# Patient Record
Sex: Female | Born: 1990 | Race: Black or African American | Hispanic: No | Marital: Single | Smoking: Never smoker
Health system: Southern US, Community
[De-identification: ages and names within clinical notes are randomized; demographics above are authoritative.]

---

## 2011-05-09 ENCOUNTER — Emergency Department: Payer: Self-pay | Admitting: Emergency Medicine

## 2012-04-18 IMAGING — CR DG CHEST 2V
1 series · 2 of 2 positions shown · non-contrast
Comparison: none

REASON FOR EXAM: cough, pain in upper back with breathing
COMMENTS:   LMP: Two weeks ago

PROCEDURE:     DXR - DXR CHEST PA (OR AP) AND LATERAL  - May 09, 2011  [DATE]
RESULT:     The lung fields are clear. The heart, mediastinal and osseous
structures show no significant abnormalities.

[Series 1: pa · 0.17mm/px · 2 of 2 slices shown]
[im 1/2]
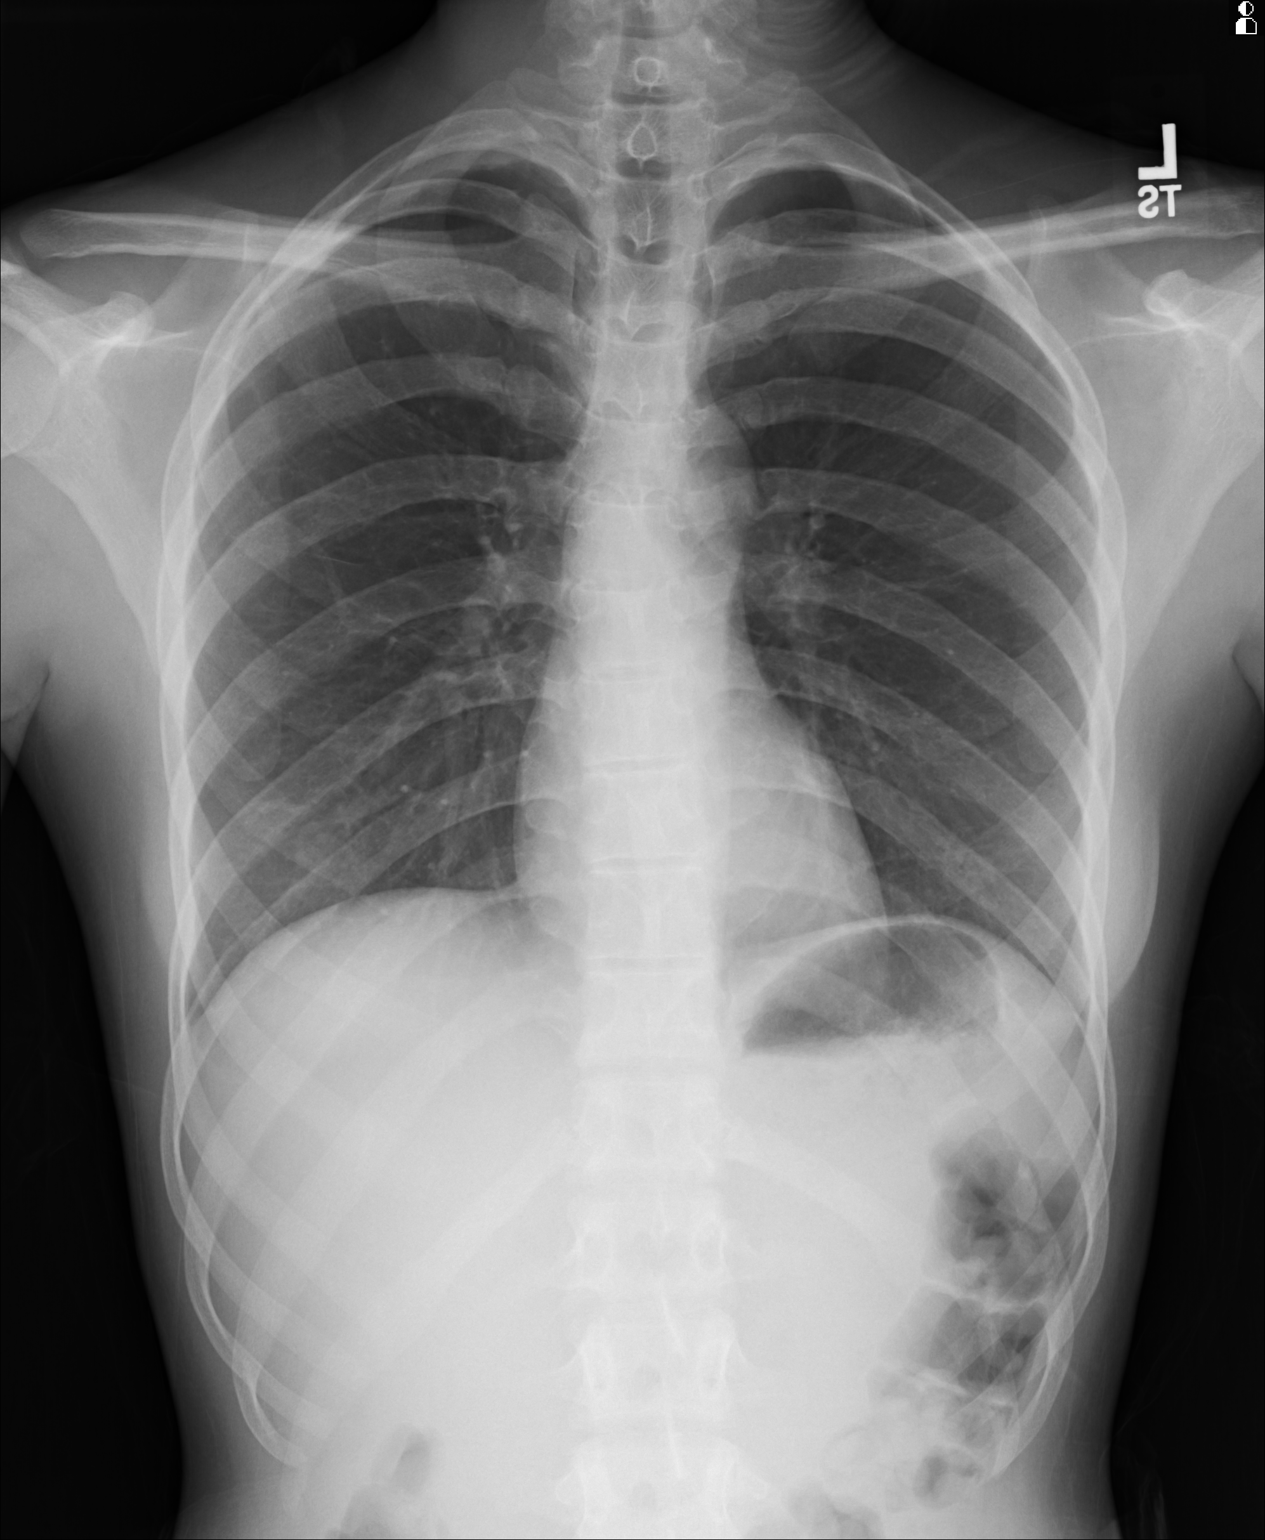
[im 2/2]
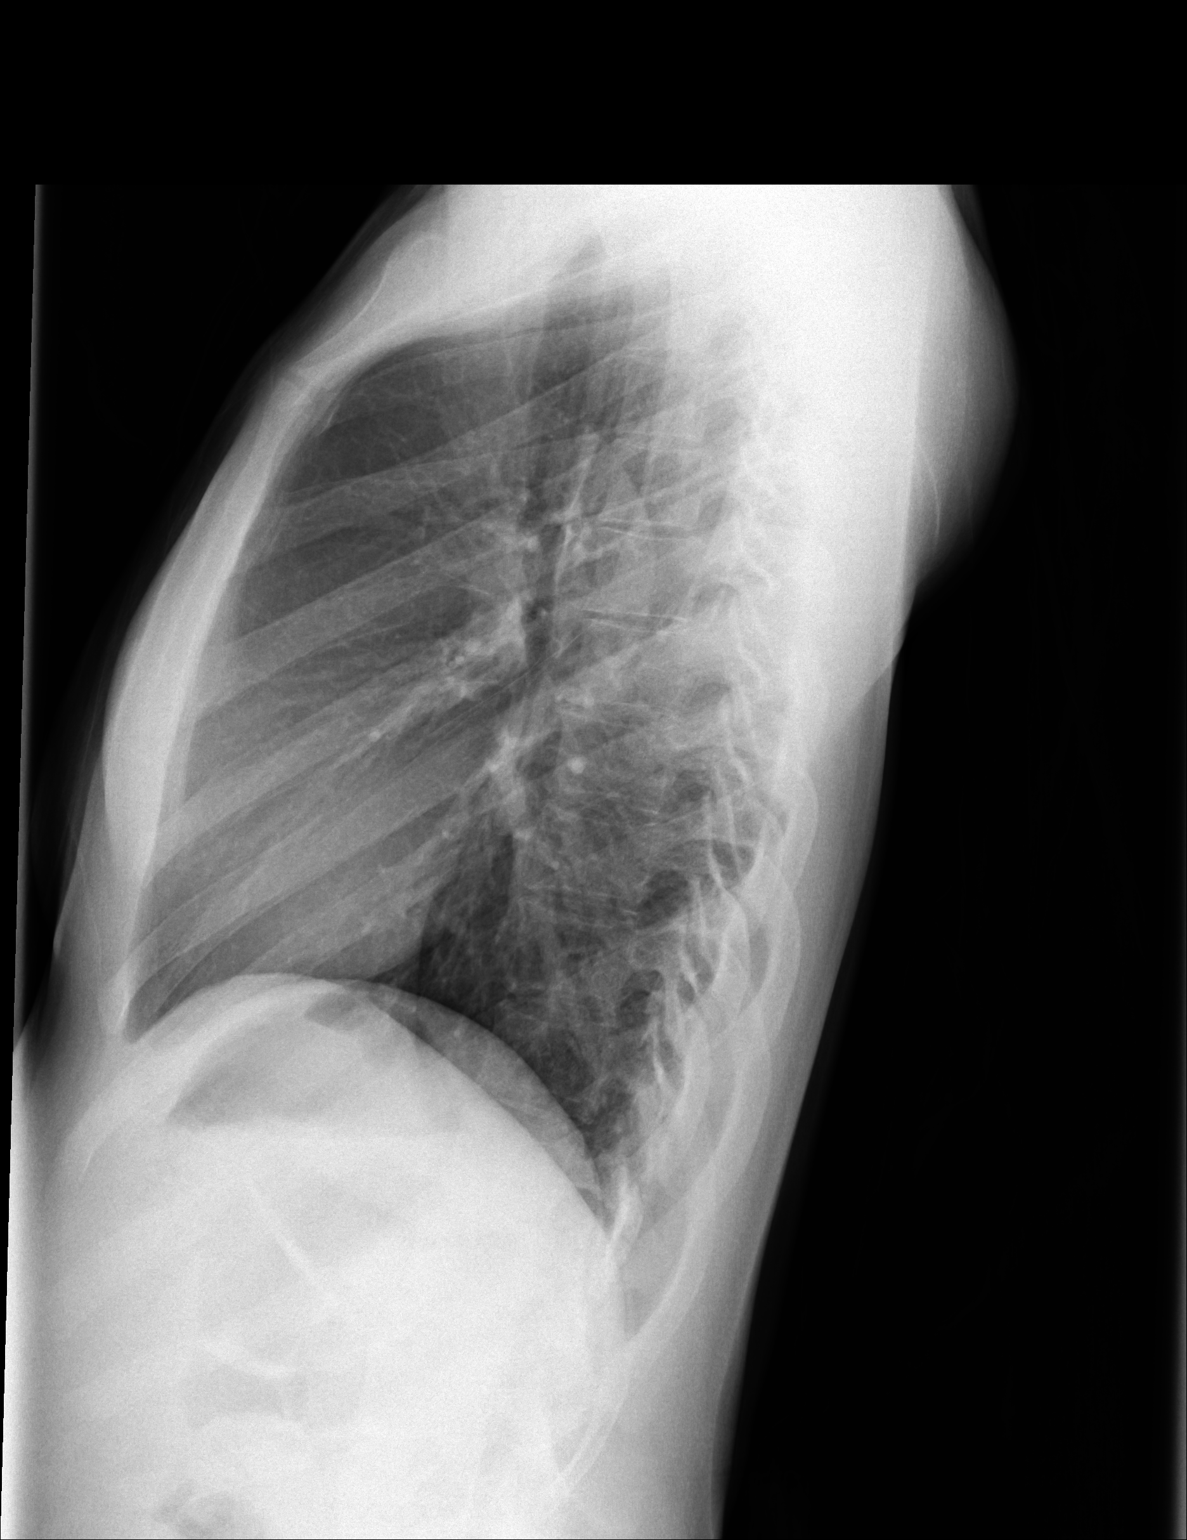

[2 of 2 positions shown; findings below may reference images not displayed]

IMPRESSION: 1.     No acute changes are identified.

## 2012-09-18 LAB — ACETAMINOPHEN LEVEL: Acetaminophen: <2 ug/mL

## 2012-09-18 LAB — TSH: Thyroid Stimulating Horm: 3.05 u[IU]/mL

## 2012-09-18 LAB — CBC
HGB: 13.8 g/dL (ref 12.0–16.0)
MCHC: 34.9 g/dL (ref 32.0–36.0)
Platelet: 293 10*3/uL (ref 150–440)
RBC: 4.16 10*6/uL (ref 3.80–5.20)
WBC: 5.6 10*3/uL (ref 3.6–11.0)

## 2012-09-18 LAB — URINALYSIS, COMPLETE
Bacteria: NONE SEEN
Ketone: NEGATIVE
Protein: NEGATIVE
Specific Gravity: 1.01 (ref 1.003–1.030)

## 2012-09-18 LAB — COMPREHENSIVE METABOLIC PANEL
Bilirubin,Total: 1.2 mg/dL — ABNORMAL HIGH (ref 0.2–1.0)
Co2: 27 mmol/L (ref 21–32)
Creatinine: 0.71 mg/dL (ref 0.60–1.30)
EGFR (Non-African Amer.): >60
Glucose: 70 mg/dL (ref 65–99)
Osmolality: 268 (ref 275–301)
SGPT (ALT): 18 U/L (ref 12–78)

## 2012-09-18 LAB — DRUG SCREEN, URINE
Amphetamines, Ur Screen: NEGATIVE (ref ?–1000)
Barbiturates, Ur Screen: NEGATIVE (ref ?–200)
Benzodiazepine, Ur Scrn: NEGATIVE (ref ?–200)
Cannabinoid 50 Ng, Ur ~~LOC~~: NEGATIVE (ref ?–50)
MDMA (Ecstasy)Ur Screen: NEGATIVE (ref ?–500)
Methadone, Ur Screen: NEGATIVE (ref ?–300)
Opiate, Ur Screen: NEGATIVE (ref ?–300)
Tricyclic, Ur Screen: NEGATIVE (ref ?–1000)

## 2012-09-18 LAB — ETHANOL: Ethanol: <3 mg/dL

## 2012-09-18 LAB — SALICYLATE LEVEL: Salicylates, Serum: <1.7 mg/dL

## 2012-09-19 ENCOUNTER — Inpatient Hospital Stay: Payer: Self-pay | Admitting: Psychiatry

## 2012-09-21 LAB — HCG, QUANTITATIVE, PREGNANCY: Beta Hcg, Quant.: 364 m[IU]/mL — ABNORMAL HIGH

## 2013-07-16 ENCOUNTER — Ambulatory Visit: Payer: Self-pay | Admitting: Family Medicine

## 2013-08-30 IMAGING — US US OB < 14 WEEKS - US OB TV
1 series · 14 of 28 positions shown · non-contrast
Comparison: none

REASON FOR EXAM: positive pregnancy test, recent D+C
COMMENTS:

PROCEDURE:     US  - US OB LESS THAN 14 WEEKS/W TRANS  - September 19, 2012  [DATE]
RESULT:     Comparison: None
TECHNIQUE: Multiple transabdominal gray-scale images and endovaginal
gray-scale images with doppler images of the pelvis performed.

[Series 1: us ob < 14 weeks - us ob tv · 0.25mm/px · 14 of 97 slices shown]
[im 4/97]
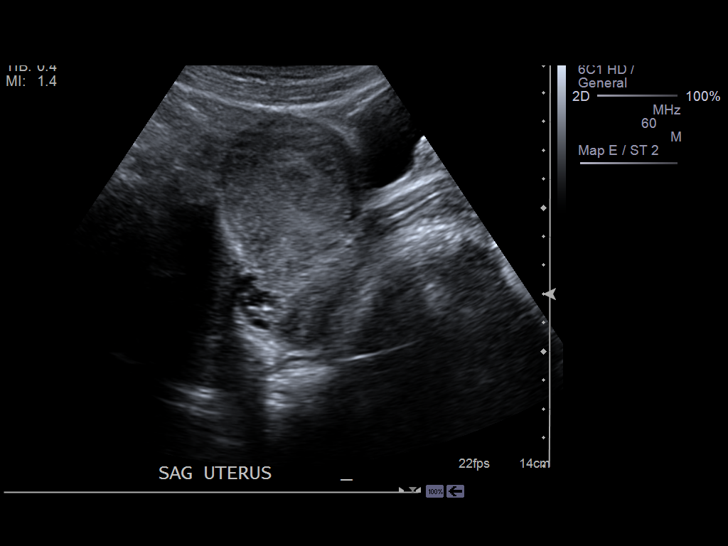
[im 11/97]
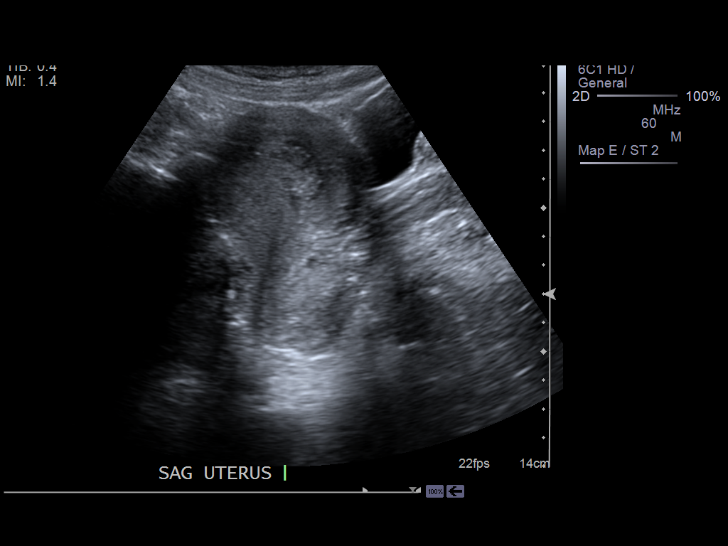
[im 18/97]
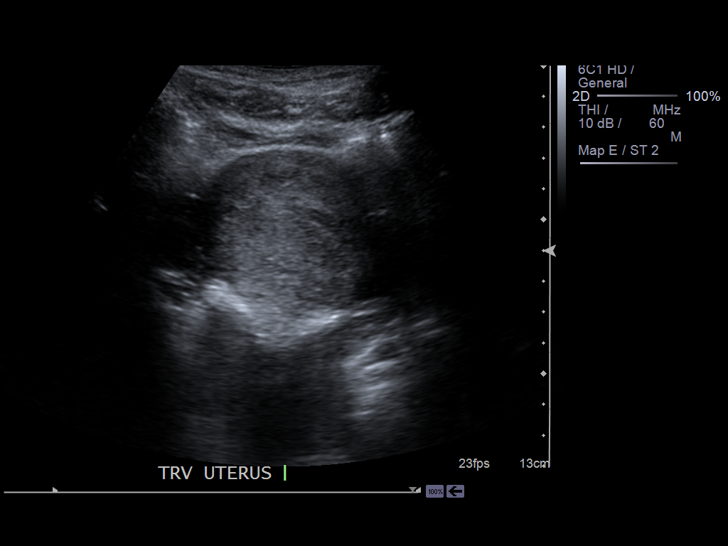
[im 25/97]
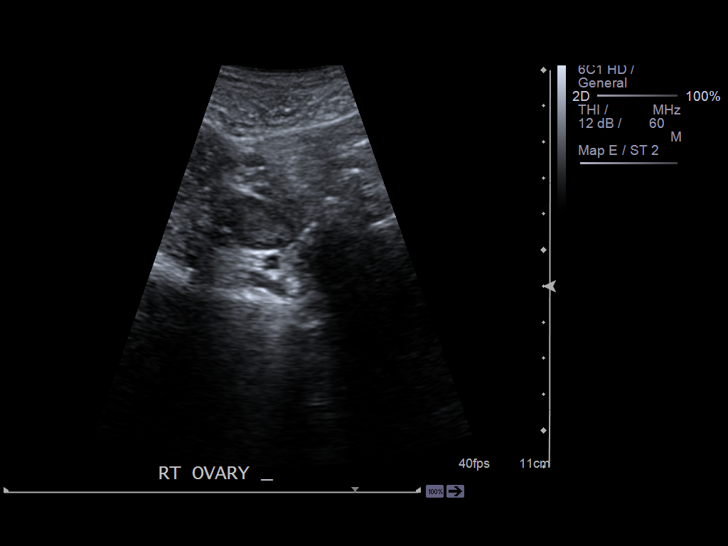
[im 33/97]
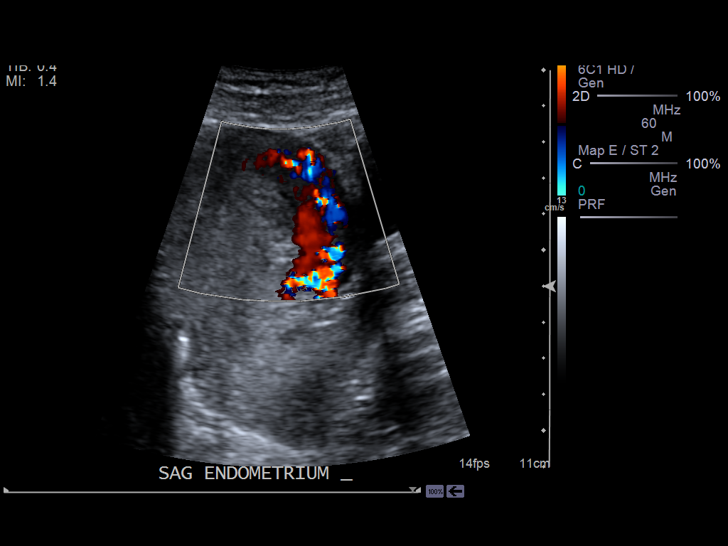
[im 40/97]
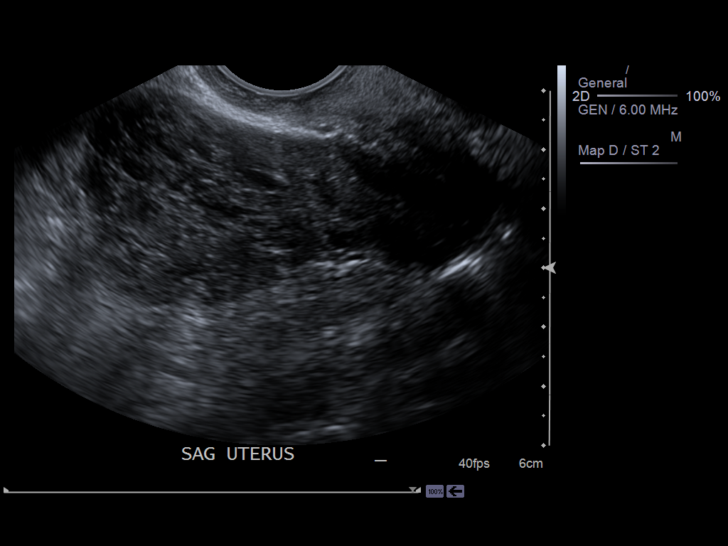
[im 47/97]
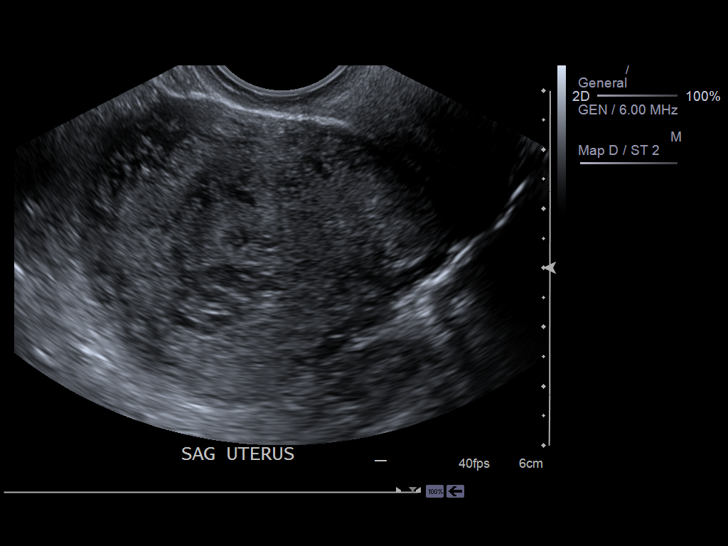
[im 54/97]
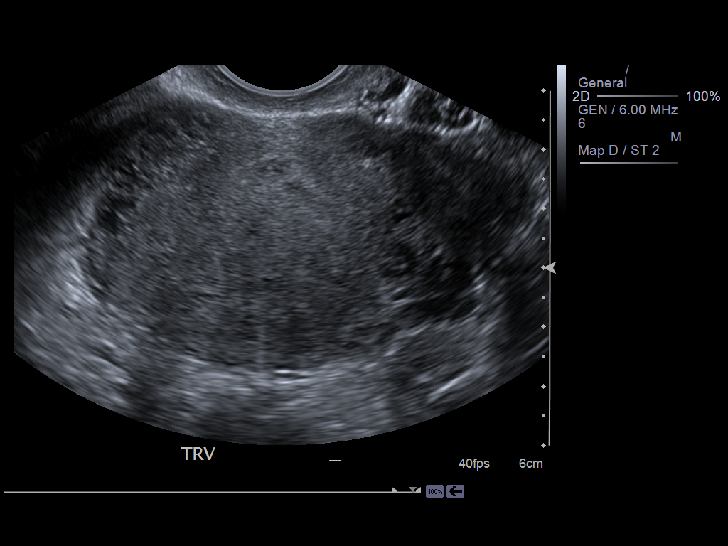
[im 61/97]
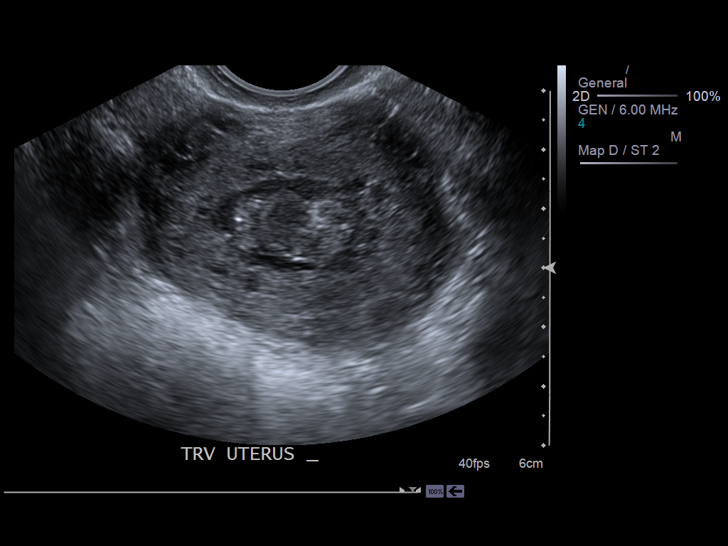
[im 68/97]
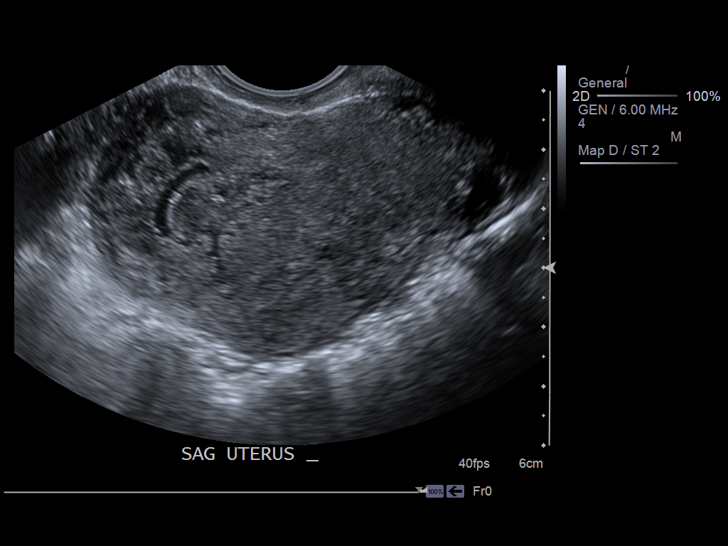
[im 75/97]
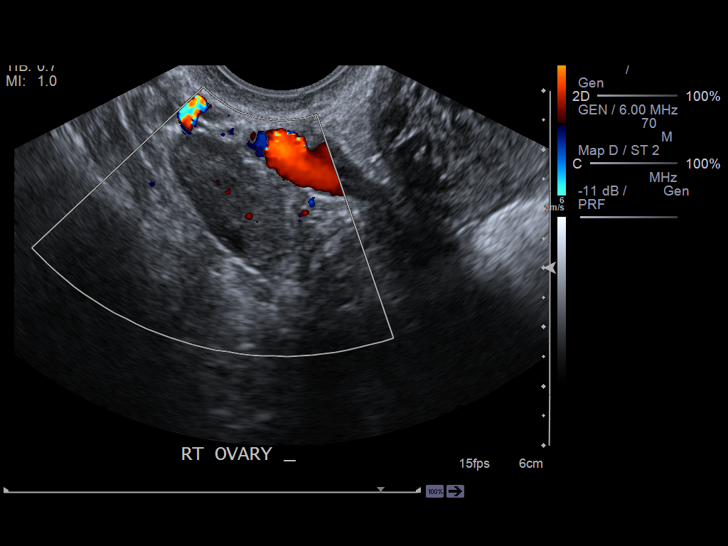
[im 82/97]
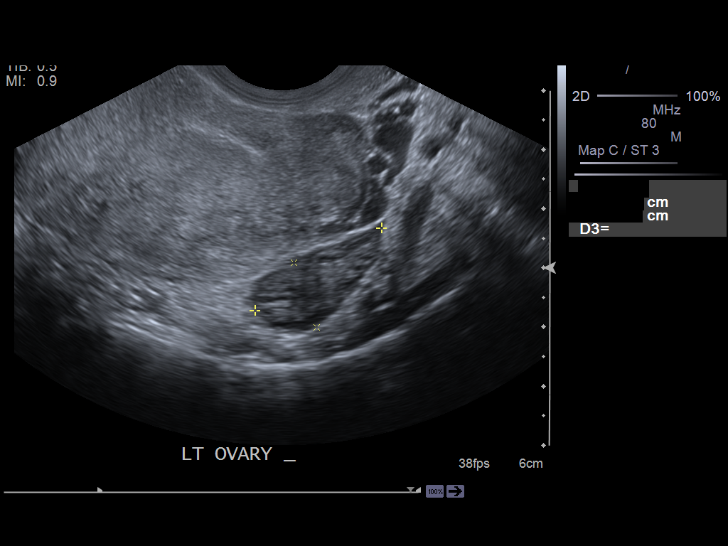
[im 89/97]
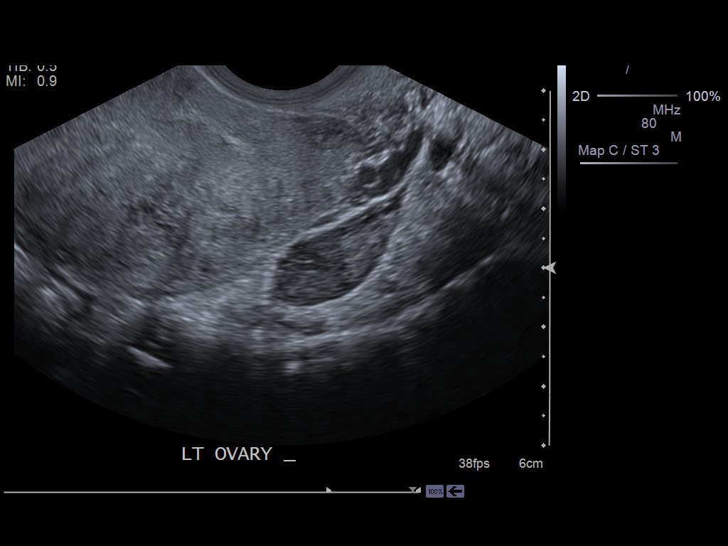
[im 97/97]
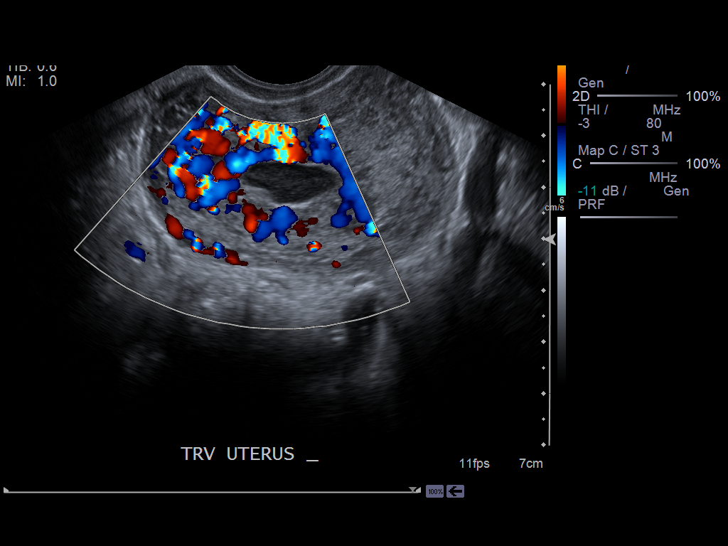

[14 of 28 positions shown; findings below may reference images not displayed]

FINDINGS: The uterus is normal in echotexture measuring 9 x 5.6 x 5.5 cm , with
transabdominal ultrasound. The endometrium is heterogeneous in echotexture
measuring 16.4 mm without internal Doppler flow..  There are no abnormal
solid or cystic myometrial mass lesions noted.

The right ovary measures 2.8 x 1.8 x 1.2 cm.  The left ovary measures 2.6 x
1.4 x 1.2 cm.  There is no adnexal mass.

There is no pelvic free fluid.
IMPRESSION: Heterogeneously mildly thickened endometrium likely reflecting blood
products. There is no significant internal Doppler flow. No definite
products of conception are identified, , but cannot be excluded given the
elevated beta-hCG. Recommend clinical correlation, serial quantitative beta
HCGs and followup ultrasound..

[REDACTED]

## 2014-03-06 ENCOUNTER — Ambulatory Visit: Payer: Self-pay | Admitting: Internal Medicine

## 2014-03-06 LAB — COMPREHENSIVE METABOLIC PANEL
AST: 17 U/L (ref 15–37)
Albumin: 3.9 g/dL (ref 3.4–5.0)
Alkaline Phosphatase: 70 U/L
Anion Gap: 14 (ref 7–16)
BUN: 11 mg/dL (ref 7–18)
Bilirubin,Total: 0.7 mg/dL (ref 0.2–1.0)
CHLORIDE: 103 mmol/L (ref 98–107)
CO2: 23 mmol/L (ref 21–32)
Calcium, Total: 9 mg/dL (ref 8.5–10.1)
Creatinine: 0.76 mg/dL (ref 0.60–1.30)
Glucose: 97 mg/dL (ref 65–99)
Osmolality: 279 (ref 275–301)
Potassium: 3.4 mmol/L — ABNORMAL LOW (ref 3.5–5.1)
SGPT (ALT): 13 U/L — ABNORMAL LOW
Sodium: 140 mmol/L (ref 136–145)
Total Protein: 8.1 g/dL (ref 6.4–8.2)

## 2014-03-06 LAB — CBC WITH DIFFERENTIAL/PLATELET
BASOS PCT: 0.7 %
Basophil #: 0.1 10*3/uL (ref 0.0–0.1)
EOS PCT: 0.5 %
Eosinophil #: 0 10*3/uL (ref 0.0–0.7)
HCT: 35.4 % (ref 35.0–47.0)
HGB: 12.1 g/dL (ref 12.0–16.0)
Lymphocyte #: 2.8 10*3/uL (ref 1.0–3.6)
Lymphocyte %: 29.2 %
MCH: 31.9 pg (ref 26.0–34.0)
MCHC: 34 g/dL (ref 32.0–36.0)
MCV: 94 fL (ref 80–100)
MONO ABS: 0.4 x10 3/mm (ref 0.2–0.9)
MONOS PCT: 4.4 %
NEUTROS ABS: 6.3 10*3/uL (ref 1.4–6.5)
Neutrophil %: 65.2 %
Platelet: 277 10*3/uL (ref 150–440)
RBC: 3.78 10*6/uL — AB (ref 3.80–5.20)
RDW: 13.4 % (ref 11.5–14.5)
WBC: 9.6 10*3/uL (ref 3.6–11.0)

## 2014-03-06 LAB — PROTIME-INR
INR: 1
PROTHROMBIN TIME: 12.6 s (ref 11.5–14.7)

## 2014-03-06 LAB — APTT: Activated PTT: 24 secs (ref 23.6–35.9)

## 2014-06-06 NOTE — H&P (Signed)
PATIENT NAME:  Sabrina Graham, Sabrina Graham MR#:  [id number] DATE OF BIRTH:  09/06/1990  REFERRING PHYSICIAN: Emergency Room M.D.   ATTENDING PHYSICIAN: Kristine Linea, M.D.   IDENTIFYING DATA: Sabrina Graham is a 24 year old female with no past psychiatric history.   CHIEF COMPLAINT: "It was a mistake."   HISTORY OF PRESENT ILLNESS: Sabrina Graham was brought to the hospital after an overdose on ibuprofen. She was arguing with her boyfriend and after they decided to separate, she took some pills in front of him. He called the ambulance. The patient denies any symptoms of depression or anxiety. However, she had a  pretty difficulty year. In May 2013, she had an abortion. She thinks  that it was her mother's idea who felt that the child would impede her chances to do well in school. She did not want to do it but did it anyway. Since then, she felt sad and a little uneasy. She did not get any professional help. She became pregnant again in the spring of this year and started seeing a counselor then. Unfortunately, she had a miscarriage and D and C in July. She feels that she would benefit from psychotherapy. She does not feel that medications are necessary at this point. She denies any symptoms of psychosis or symptoms suggestive of bipolar mania. She denies alcohol or illicit drugs or prescription pill abuse.   PAST PSYCHIATRIC HISTORY: None. She was never admitted. No suicide attempts in the past.   FAMILY PSYCHIATRIC HISTORY: None.   PAST MEDICAL HISTORY: Recent D and C.   ALLERGIES: No known drug allergies.   MEDICATIONS ON ADMISSION: None.   SOCIAL HISTORY: She lives with her mother. Her younger sister just had a baby which makes it more difficult for the patient. Apparently the mother now is okay with babies. She has a boyfriend of three years. Both of her pregnancies were with the same boyfriend. She hopes that they will get back together. She works at [employer] and is going back to school this semester.    REVIEW OF SYSTEMS:  CONSTITUTIONAL: No fevers or chills. No weight changes.  EYES: No double or blurred vision.  ENT: No hearing loss.  RESPIRATORY: No shortness of breath or cough.  CARDIOVASCULAR: No chest pain or orthopnea.  GASTROINTESTINAL: No abdominal pain, nausea, vomiting, or diarrhea.  GENITOURINARY: No incontinence or frequency.  ENDOCRINE: No heat or cold intolerance.  LYMPHATIC: No anemia or easy bruising.  INTEGUMENTARY: No acne or rash.  MUSCULOSKELETAL: No muscle or joint pain.  NEUROLOGIC: No tingling or weakness.  PSYCHIATRIC: See history of present illness for details.   PHYSICAL EXAMINATION:  VITAL SIGNS: Blood pressure 110/82, pulse 100, respirations 18, temperature 98.3.  GENERAL: This is a slender young female in no acute distress.  HEENT: Pupils are equal, round, and reactive to light. Sclerae are anicteric.  NECK: Supple. No thyromegaly.  LUNGS: Clear to auscultation. No dullness to percussion.  HEART: Regular rhythm and rate. No murmurs, rubs, or gallops.  ABDOMEN: Soft, nontender, nondistended. Positive bowel sounds.  MUSCULOSKELETAL: Normal muscle strength in all extremities.  SKIN: No rashes or bruises.  LYMPHATIC: No cervical adenopathy.  NEUROLOGIC: Cranial nerves II through XII are intact.   LABORATORY DATA: Chemistries are within normal limits. Blood alcohol level is 0. LFTs within normal limits except for total bili of 1.2. TSH 3.05. Urine tox screen negative for substances. CBC within normal limits. Urinalysis is not suggestive of urinary tract infection. Serum acetaminophen and salicylates are low. Urine pregnancy  test is positive, beta-hCG 441.   MENTAL STATUS EXAMINATION ON ADMISSION: The patient is alert and oriented to person, place, time, and situation. She is pleasant, polite and cooperative. She is well groomed and casually dressed. She maintains good eye contact. Her speech is of normal rhythm, rate and volume. Mood is fine with full  affect. Thought processing is logical and goal oriented. Thought content: She denies suicidal or homicidal ideation but was admitted after a suicide attempt by overdose. There are now no delusions or paranoia. There are no auditory or visual hallucinations. Her cognition is grossly intact. She registers three out of three and recalls three out of three objects after five minutes. She can spell "world" forward and backward. She knows the current president. Her insight and judgment are fair.   SUICIDE RISK ASSESSMENT ON ADMISSION: This is a patient with no past psychiatric history under considerable stress for the past year due to abortion, who overdosed on medication while arguing with her boyfriend.   DIAGNOSES:  AXIS I: Adjustment disorder with depressed mood.  AXIS II: Deferred.  AXIS III: Recent dilatation and curettage.  AXIS IV: Relationship problems, major loss.  AXIS V: Global assessment of functioning at 25.   PLAN: The patient was admitted to [facility] Behavioral Medicine unit for safety, stabilization and medication management. She was initially placed on suicide precautions and was closely monitored for any unsafe behaviors. She underwent full psychiatric and risk assessment. She received pharmacotherapy, individual and group psychotherapy, substance abuse counseling, and support from therapeutic milieu.  1.  SUICIDAL IDEATION: She denies.  2.  MOOD: She does not wish any psychotropic medications.  3.  DISPOSITION: She will return to her mother.   ____________________________ Ellin GoodieJolanta B. Jennet MaduroPucilowska, MD jbp:np D: 09/20/2012 15:27:54 ET T: 09/20/2012 17:22:04 ET JOB#: [id number]  cc: Jolanta B. Jennet MaduroPucilowska, MD, <Dictator> Shari ProwsJOLANTA B PUCILOWSKA MD ELECTRONICALLY SIGNED 09/21/2012 6:39

## 2014-06-06 NOTE — Consult Note (Signed)
PATIENT NAME:  Sabrina Graham, Sabrina Graham MR#:  [id number] DATE OF BIRTH:  04-14-1990  DATE OF CONSULTATION:  09/19/2012  CONSULTING PHYSICIAN:  Sabrina Price. Jaclynn Major, MD  CHIEF COMPLAINT: "My boyfriend brought me here because I took a bunch of ibuprofen. I just felt overwhelmed, stressed out. I usually cope better than this"   HISTORY OF PRESENT ILLNESS: "My boyfriend broke up with me." So this 24 year old single Afro-American female was brought to the Emergency Department after taking a handful about 20 ibuprofen tabs. She goes on to say that she believes it began in May 2013 when she had an abortion that she did not want. She says, "that was mom's doing." She goes on to say "my mom is very controlling." "I haven't talked to anyone about it." She reports that she goes to pregnancy support services and sees someone named Sabrina Graham, but she does not think is helping very much. She reports that she just tries not to think about it.   She then goes on to say that she had a D and C July 2014.   She then goes back to feeling overwhelmed and stressed out because of the break-up with her boyfriend. She reports that he broke up with her the day prior to admission to the ED. She goes on to describe her life is hopeless and worthless. "He broke up with me because I told him I had sex with somebody else. "A couple of months ago, I found out that he had cheated on me and I wanted to get back at him." "After I found out he cheated on me. I went to the health department and was told I had gonorrhea." "I just did that because I wanted to get back at him." She goes on to say that she does not think she is pregnant because she is spotting. She has been treated for the sexually transmitted disease. She ends by saying, "We had this talk on Sunday here I am."   She reports that she her appetite is variable, sometimes she used nothing and sometimes she "eats all the time." She reports that she has some difficulty going to sleep and maintaining  sleep. She believes her concentration and energy are decreased.   She ends by saying, " I do not know why I took those pills I have never done anything like that before."   She reports that she has difficulties with this relationship, and that it has been very hard for her. That she did not want to break up with her boyfriend.   She denies any homicidal ideation, intent or plan.   ALCOHOL AND DRUG USE: None.   MENTAL STATUS EXAM: Sabrina Graham is an attractive, appropriately dressed young woman,  who is a cooperative and pleasant on interview. She waxes and wanes on issues of sleeping and appetite. Her mood is anxious, sad and she is tearful in the interview. Her thought content is full of hopelessness and not understanding her actions. Her thought process is somewhat tangential and circumstantial. Her memory appears to be intact. She denies any hallucinations. She endorses suicidal ideation and an attempt. She reports that her energy level is okay to poor. Psychomotorically, she appears restless and a bit agitated. She is oriented x 4. Her speech is within normal limits. Her insight is minimal at best. She has very poor judgment.   SOCIAL HISTORY: She reports she lives with her mother her stepfather, her sister and her niece. She reports that she can return to her  present living situation and that she feels safe there.  She reports that her mother and her boyfriend help her control her behavior in order to prevent harm to herself or others.   It should be noted that while Sabrina was  in the ED, her father paid her up visit. She asked me to speak with him and I did explained  to the father about the involuntary commitment about and Sabrina's  wishes and willingness to come into the hospital for evaluation and help. He became very angry and started accusing her "not following God's plan for her." He continues to talk about this and Sabrina she became tearful. He also said that "she needs to get back to work,  she might lose her job over this." Sabrina became tearful and upset about that too. He said he wanted to sign her out and I pointed out that she was old enough to make those decisions on her own, as she was 24 years old. So he then said to her, " well what are you waiting for, sign yourself out."   ALLERGIES: No known drug allergies.   MEDICATIONS: None.   MEDICAL INFORMATION:  Sabrina reports to me that her last menstrual period was in April 2014. She then goes on to say that she had a D and C in July 2014. It  should be noted that her pregnancy test here urine pregnancy test here was positive, so a beta-hCG was gotten on her and it is 441. She is going to have an ultrasound and an OB/GYN consult per Kate SableWoodward.   She denies any history of physical or sexual abuse.   DIAGNOSES PRINCIPAL AND PRIMARY:  AXIS I:  Depressive disorder, not otherwise specified   AXIS III: See above medical Information.   PLAN: Ms. Raliegh IpMyesha Graham even though she is IVC reports that she is willing to come in the hospital for an evaluation.    ____________________________ Sabrina PriceFrances C. Jaclynn MajorGreason, MD fcg:cc D: 09/19/2012 13:35:21 ET T: 09/19/2012 14:35:48 ET JOB#: [id number]  cc: Sabrina PriceFrances C. Jaclynn MajorGreason, MD, <Dictator> Maryan PulsFRANCES C Novi Calia MD ELECTRONICALLY SIGNED 09/19/2012 16:53

## 2015-02-14 IMAGING — US US PELV - US TRANSVAGINAL
1 series · 14 of 25 positions shown · non-contrast
Comparison: 09/19/2012

CLINICAL DATA: Pelvic pain, severe cramping, and bleeding after
outpatient procedure

EXAM:
TRANSABDOMINAL AND TRANSVAGINAL ULTRASOUND OF PELVIS
TECHNIQUE: Both transabdominal and transvaginal ultrasound examinations of the
pelvis were performed. Transabdominal technique was performed for
global imaging of the pelvis including uterus, ovaries, adnexal
regions, and pelvic cul-de-sac. It was necessary to proceed with
endovaginal exam following the transabdominal exam to visualize the
ovaries and endometrium.

[Series 1: us pelv - us transvaginal · 0.17mm/px · 14 of 90 slices shown]
[im 1/90]
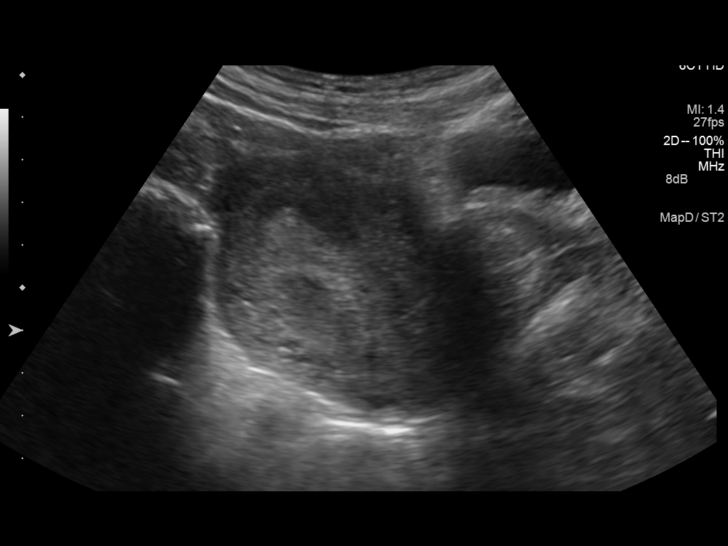
[im 8/90]
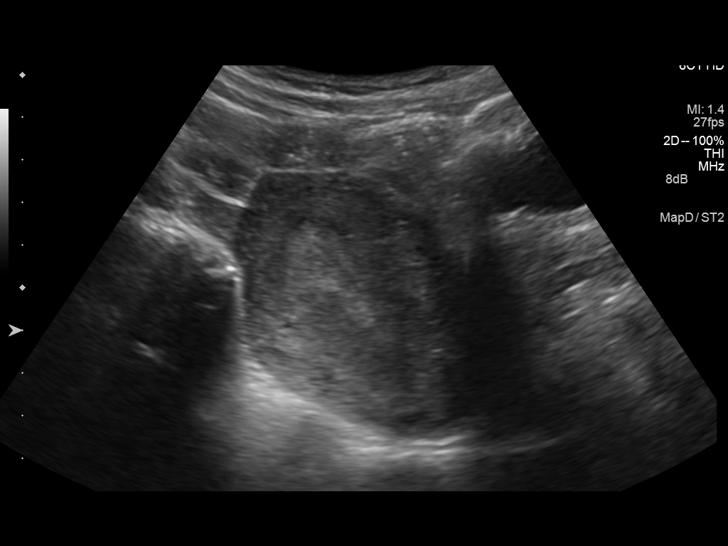
[im 15/90]
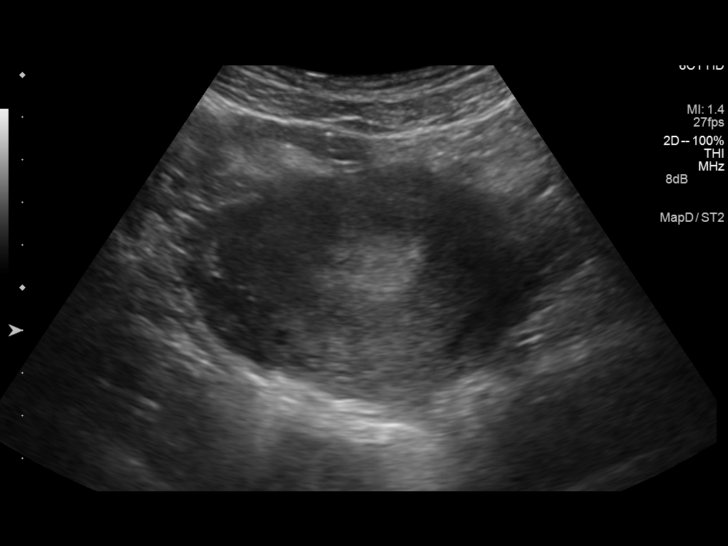
[im 23/90]
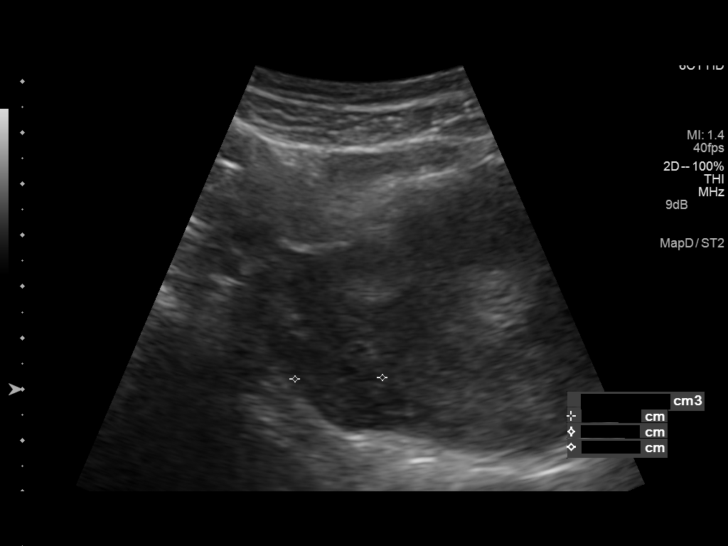
[im 30/90]
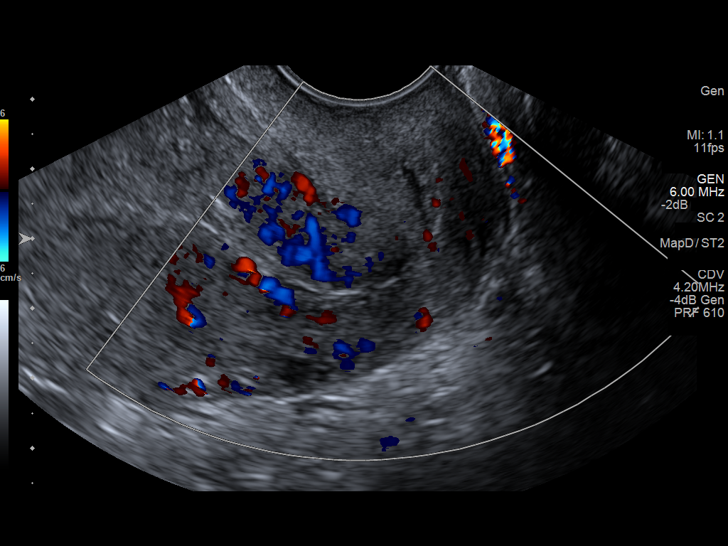
[im 34/90]
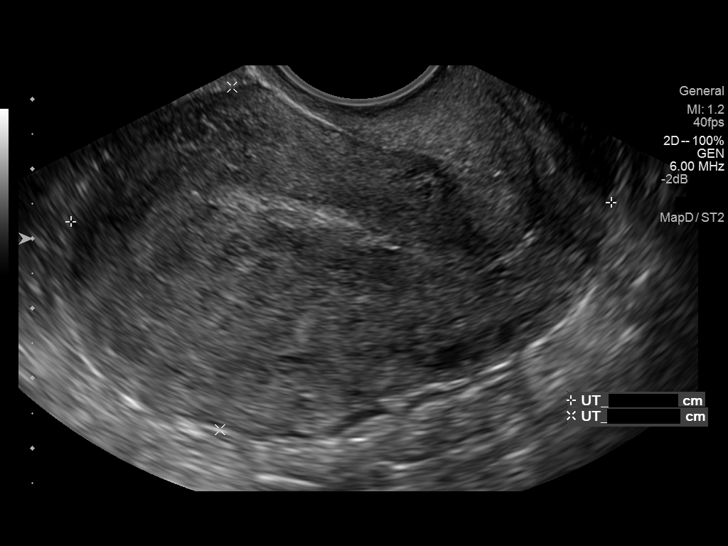
[im 41/90]
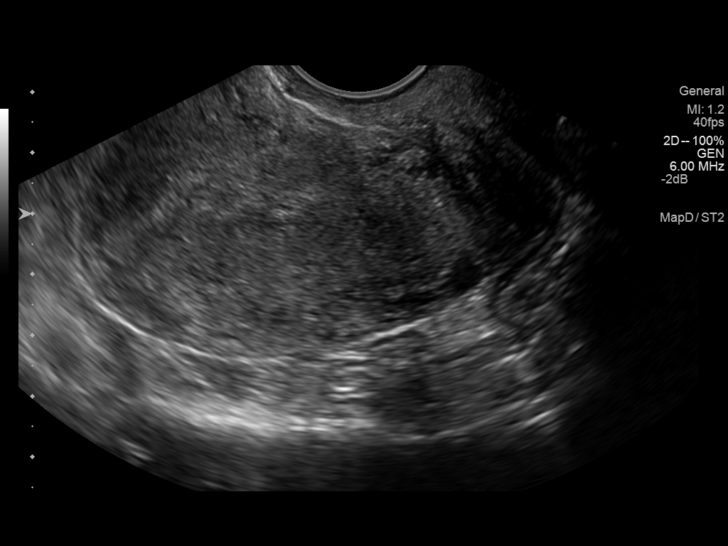
[im 49/90]
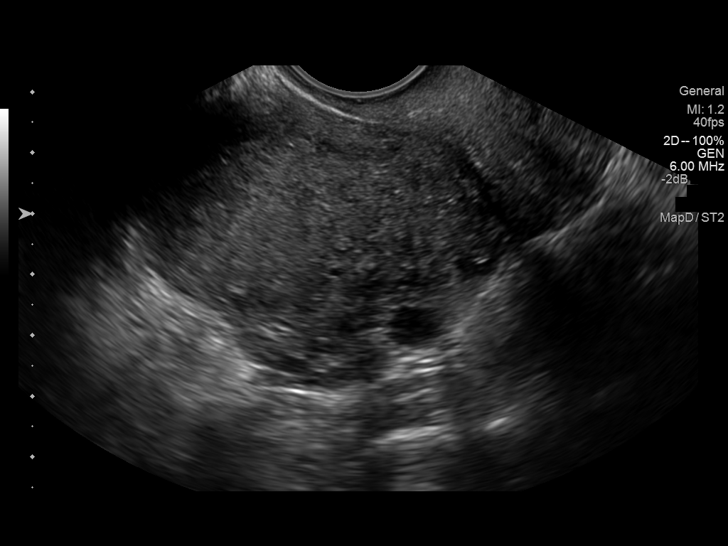
[im 56/90]
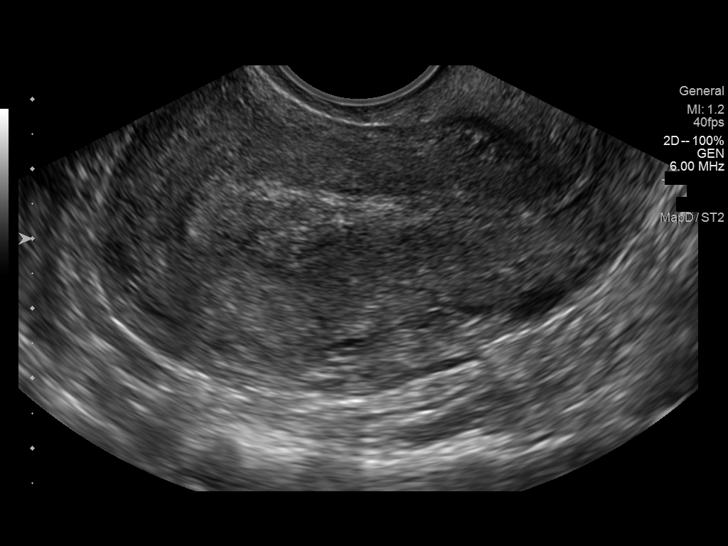
[im 60/90]
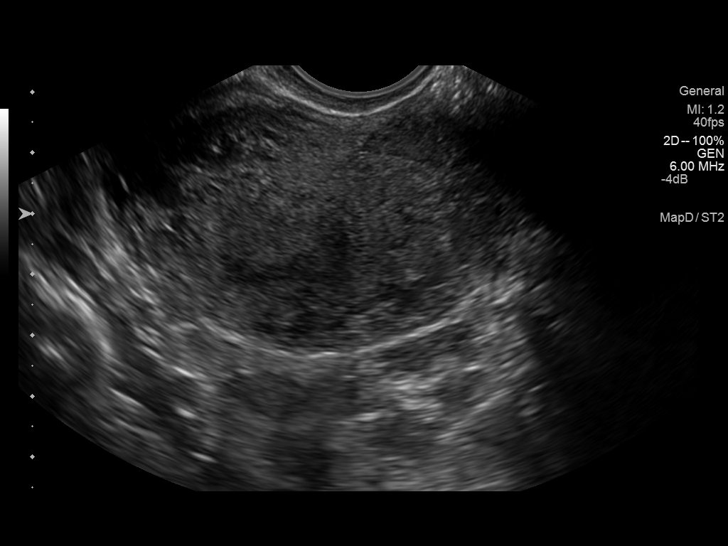
[im 67/90]
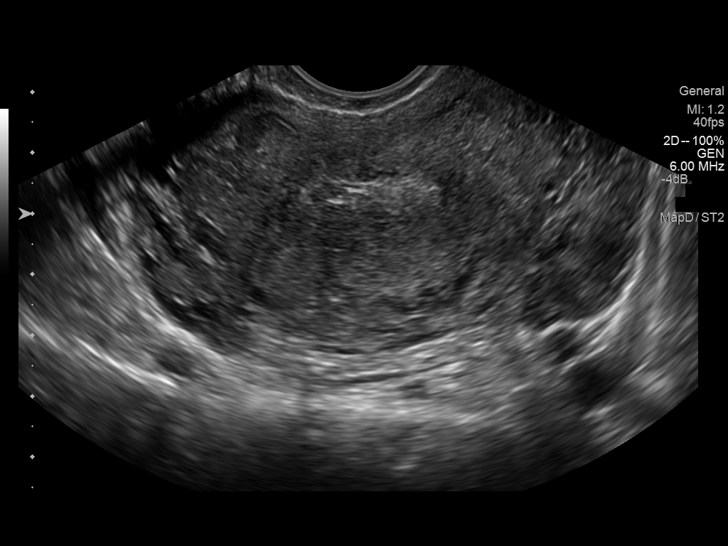
[im 75/90]
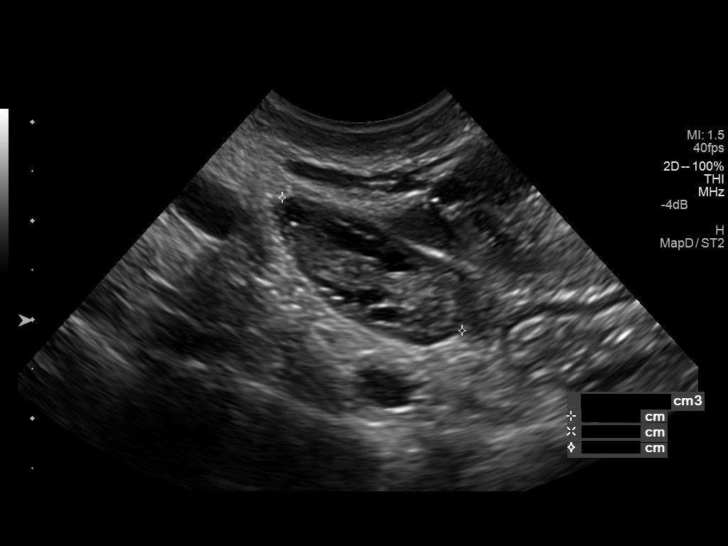
[im 82/90]
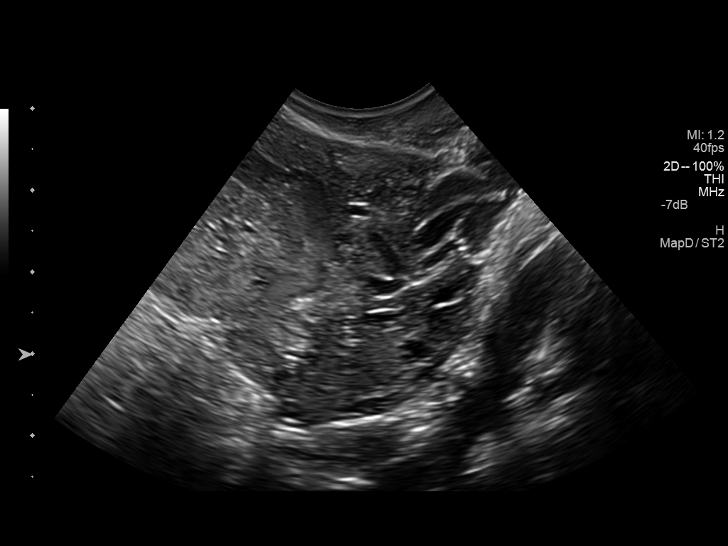
[im 90/90]
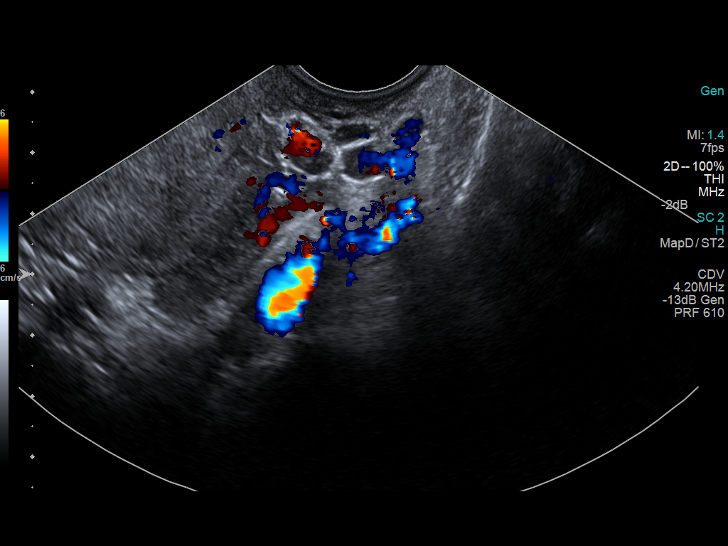

[14 of 25 positions shown; findings below may reference images not displayed]

FINDINGS: Uterus

Measurements: 7.7 x 4.9 x 5.4 cm. Anteflexed. No focal myometrial
mass.

Endometrium

Thickness: 13 mm thick. Heterogeneous appearance of endometrium
without discrete mass or definite fluid collection

Right ovary

Measurements: 3.1 x 1.2 x 2.3 cm. Normal morphology without mass.

Left ovary

Measurements: 2.8 x 1.2 x 2.7 cm. Normal morphology without mass.

Other findings

No free pelvic fluid or adnexal masses.
IMPRESSION: Nonspecific heterogeneous appearance of myometrium measuring 13 mm
thick.

Otherwise negative exam.

## 2019-07-31 ENCOUNTER — Ambulatory Visit: Payer: Self-pay | Admitting: Podiatry

## 2019-08-08 ENCOUNTER — Ambulatory Visit (INDEPENDENT_AMBULATORY_CARE_PROVIDER_SITE_OTHER): Payer: 59 | Admitting: Podiatry

## 2019-08-08 ENCOUNTER — Other Ambulatory Visit: Payer: Self-pay

## 2019-08-08 ENCOUNTER — Encounter: Payer: Self-pay | Admitting: Podiatry

## 2019-08-08 ENCOUNTER — Ambulatory Visit (INDEPENDENT_AMBULATORY_CARE_PROVIDER_SITE_OTHER): Payer: 59

## 2019-08-08 VITALS — BP 112/76 | HR 97

## 2019-08-08 DIAGNOSIS — M205X9 Other deformities of toe(s) (acquired), unspecified foot: Secondary | ICD-10-CM | POA: Diagnosis not present

## 2019-08-08 DIAGNOSIS — M2041 Other hammer toe(s) (acquired), right foot: Secondary | ICD-10-CM

## 2019-08-08 DIAGNOSIS — M2040 Other hammer toe(s) (acquired), unspecified foot: Secondary | ICD-10-CM

## 2019-08-08 DIAGNOSIS — M2042 Other hammer toe(s) (acquired), left foot: Secondary | ICD-10-CM | POA: Diagnosis not present

## 2019-08-08 NOTE — Progress Notes (Signed)
This patient presents to the office with chief complaint of painful knots on her second toes  Both feet.  She says she has been dealing with her painful toes for 10 years with the last 2-3 most painful.  She has provided no self treatment and has been planning on evaluation of her feet for years.  She presents for evaluation and treatment.  Vascular  Dorsalis pedis and posterior tibial pulses are palpable  B/L.  Capillary return  WNL.  Temperature gradient is  WNL.  Skin turgor  WNL  Sensorium  Senn Weinstein monofilament wire  WNL. Normal tactile sensation.  Nail Exam  Patient has normal nails with no evidence of bacterial or fungal infection.  Orthopedic  Exam  Muscle tone and muscle strength  WNL.  No limitations of motion feet  B/L.  No crepitus or joint effusion noted.  Foot type is unremarkable and digits show no abnormalities.  Elongated second toes with contractures at PIPJ and DIPJ  B/L.  Skin  No open lesions.  Normal skin texture and turgor.  Hammer toes  B/L  Mallet Toes  B/L.  IE.  Xrays reveal long proximal phalanges second toe.  Mild contracture noted.  Discussed this condition with this patient.  Scheduled her a surgical consult with Dr.  Allena Katz.  RTC prn   Helane Gunther DPM

## 2019-08-13 ENCOUNTER — Other Ambulatory Visit: Payer: Self-pay

## 2019-08-13 ENCOUNTER — Ambulatory Visit (INDEPENDENT_AMBULATORY_CARE_PROVIDER_SITE_OTHER): Payer: 59 | Admitting: Podiatry

## 2019-08-13 DIAGNOSIS — M2041 Other hammer toe(s) (acquired), right foot: Secondary | ICD-10-CM

## 2019-08-13 DIAGNOSIS — M2042 Other hammer toe(s) (acquired), left foot: Secondary | ICD-10-CM | POA: Diagnosis not present

## 2019-08-13 DIAGNOSIS — Z01818 Encounter for other preprocedural examination: Secondary | ICD-10-CM | POA: Diagnosis not present

## 2019-08-14 ENCOUNTER — Encounter: Payer: Self-pay | Admitting: Podiatry

## 2019-08-14 NOTE — Progress Notes (Signed)
Subjective:  Patient ID: Sabrina Graham, female    DOB: Feb 12, 1991,  MRN: [id number]  Chief Complaint  Patient presents with  . Consult    pt is here for a possible consultation of bil second toes, pt states that it has been going on for abo    29 y.o. female presents with the above complaint.  Patient presents with complaint of bilateral hammertoe contractures at the PIPJ and DIPJ of the second toe.  Patient states that it has been going for couple years has progressive gotten worse.  She states that these corns have been very painful.  She has tried offloading it and making changes to her shoe gears.  She was referred to me by Dr. Stacie Acres who had failed all conservative therapy and patient would like to discuss surgical interventions at this time.  She states her pain scale is 8 out of 10 is dull achy in nature and she would like to have it corrected.  She denies any other acute complaints.   Review of Systems: Negative except as noted in the HPI. Denies N/V/F/Ch.  No past medical history on file.  Current Outpatient Medications:  .  metroNIDAZOLE (METROGEL) 0.75 % vaginal gel, Place vaginally at bedtime., Disp: , Rfl:  .  predniSONE (DELTASONE) 20 MG tablet, prednisone 20 mg tablet, Disp: , Rfl:   Social History   Tobacco Use  Smoking Status Never Smoker  Smokeless Tobacco Never Used    No Known Allergies Objective:  There were no vitals filed for this visit. There is no height or weight on file to calculate BMI. Constitutional Well developed. Well nourished.  Vascular Dorsalis pedis pulses palpable bilaterally. Posterior tibial pulses palpable bilaterally. Capillary refill normal to all digits.  No cyanosis or clubbing noted. Pedal hair growth normal.  Neurologic Normal speech. Oriented to person, place, and time. Epicritic sensation to light touch grossly present bilaterally.  Dermatologic Nails well groomed and normal in appearance. No open wounds. No skin  lesions.  Orthopedic:  Semiflexible hammertoe contractures noted at the bilateral second digit.  Mild to moderate dorsiflexion of the PIPJ and plantarflexion of the DIPJ noted bilaterally.  Hyperkeratotic lesion noted at the PIPJ and DIPJ of bilateral second toe.   Radiographs: 3 views of skeletally mature adult bilateral feet: Signed the date there is decreasing calcaneal inclination angle increase in talar declination angle anterior break in the cyma line.  No bunion deformity noted.  Hammertoe contracture noted of the second digit bilaterally.  No other bony abnormalities identified  Assessment:   1. Hammertoe of second toe of right foot   2. Hammertoe of second toe of left foot   3. Preoperative examination    Plan:  Patient was evaluated and treated and all questions answered.  Bilateral semiflexible second digit hammertoe contracture -I explained the patient the etiology of hammertoe contractures and various treatment options were extensively discussed.  I explained to her that the driving force of the hammertoe contractures has to do with muscular tendon imbalance with underlying pes planovalgus deformity.  Patient states understanding at this time patient would like to discuss surgical options to correct the hammertoes bilaterally as well as decrease the pain associated with it.  I discussed with her that I will be able to do both the toes at the same time and she will be placed in surgical shoe bilaterally.  I discussed with her my surgical plan and postop protocol in extensive details.  I plan on performing bilateral second digit  PIPJ DIPJ arthroplasty with oblique incision at each of the joints.  I may need to use a K wire to hold it in place.  I discussed all of this with the patient. -She will be weightbearing as tolerated in surgical shoe bilaterally -Surgical shoe were dispensed bilaterally -Informed surgical risk consent was reviewed and read aloud to the patient.  I reviewed the  films.  I have discussed my findings with the patient in great detail.  I have discussed all risks including but not limited to infection, stiffness, scarring, limp, disability, deformity, damage to blood vessels and nerves, numbness, poor healing, need for braces, arthritis, chronic pain, amputation, death.  All benefits and realistic expectations discussed in great detail.  I have made no promises as to the outcome.  I have provided realistic expectations.  I have offered the patient a 2nd opinion, which they have declined and assured me they preferred to proceed despite the risks -A total of 37 minutes was spent in direct patient care as well as pre and post patient encounter activities.  This includes documentation as well as reviewing patient chart for labs, imaging, past medical, surgical, social, and family history as documented in the EMR.  I have reviewed medication allergies as documented in EMR.  I discussed the etiology of condition and treatment options from conservative to surgical care.  All risks and benefit of the treatment course was discussed in detail.  All questions were answered and return appointment was discussed.  Since the visit completed in an ambulatory/outpatient setting, the patient and/or parent/guardian has been advised to contact the providers office for worsening condition and seek medical treatment and/or call 911 if the patient deems either is necessary.   No follow-ups on file.

## 2019-09-04 ENCOUNTER — Telehealth: Payer: Self-pay

## 2019-09-04 NOTE — Telephone Encounter (Signed)
DOS 09/09/2019  HAMMERTOE REPAIR 2ND B/L - [id number]  [employer] EFFECTIVE DATE - 02/15/2019  PLAN DEDUCTIBLE - $1250.00 w/ $794.17 REMAINING OUT OF POCKET - $4750.00 W/ $4294.17 REMAINING COPAY $0.00 COINSURANCE - 20%  CASE DETAILS NOTIFICATION/PRIOR AUTHORIZATION NUMBER CASE STATUS CASE STATUS REASON PRIMARY CARE PHYSICIAN [id number] Closed Case Was Managed And Is Now Complete - ADVANCE NOTIFY DATE/TIME ADMISSION NOTIFY DATE/TIME 09/03/2019 09:31 AM CDT - COVERAGE STATUS OVERALL COVERAGE STATUS Covered/Approved 1-2 CODE DESCRIPTION COVERAGE STATUS DECISION DATE FAC [facility] Coverage determination is reflected for the facility admission and is not a guarantee of payment for ongoing services. Covered/Approved 09/03/2019 1 [id number] Correction, hammertoe (eg, interphalange more Covered/Approved 09/03/2019 2 [id number] Correction, hammertoe (eg, interphalange more Covered/Approved 09/03/2019

## 2019-09-09 ENCOUNTER — Encounter: Payer: Self-pay | Admitting: Podiatry

## 2019-09-09 DIAGNOSIS — M2042 Other hammer toe(s) (acquired), left foot: Secondary | ICD-10-CM | POA: Diagnosis not present

## 2019-09-09 DIAGNOSIS — M2041 Other hammer toe(s) (acquired), right foot: Secondary | ICD-10-CM

## 2019-09-09 MED ORDER — OXYCODONE-ACETAMINOPHEN 10-325 MG PO TABS: 1.0000 | ORAL_TABLET | Freq: Four times a day (QID) | ORAL | 0 refills | Status: AC | PRN

## 2019-09-09 MED ORDER — IBUPROFEN 800 MG PO TABS
800.0000 mg | ORAL_TABLET | Freq: Four times a day (QID) | ORAL | 1 refills | Status: AC | PRN
Start: 2019-09-09 — End: ?

## 2019-09-09 NOTE — Addendum Note (Signed)
Addended by: Nicholes Rough on: 09/09/2019 07:20 AM   Modules accepted: Orders

## 2019-09-17 ENCOUNTER — Encounter: Payer: Self-pay | Admitting: Podiatry

## 2019-09-17 ENCOUNTER — Ambulatory Visit (INDEPENDENT_AMBULATORY_CARE_PROVIDER_SITE_OTHER): Payer: 59 | Admitting: Podiatry

## 2019-09-17 ENCOUNTER — Ambulatory Visit (INDEPENDENT_AMBULATORY_CARE_PROVIDER_SITE_OTHER): Payer: 59

## 2019-09-17 ENCOUNTER — Other Ambulatory Visit: Payer: Self-pay

## 2019-09-17 DIAGNOSIS — Z9889 Other specified postprocedural states: Secondary | ICD-10-CM

## 2019-09-17 DIAGNOSIS — M2042 Other hammer toe(s) (acquired), left foot: Secondary | ICD-10-CM

## 2019-09-17 DIAGNOSIS — M2041 Other hammer toe(s) (acquired), right foot: Secondary | ICD-10-CM | POA: Diagnosis not present

## 2019-09-17 DIAGNOSIS — M79676 Pain in unspecified toe(s): Secondary | ICD-10-CM

## 2019-09-17 NOTE — Progress Notes (Signed)
  Subjective:  Patient ID: Sabrina Graham, female    DOB: May 01, 1990,  MRN: [id number]  Chief Complaint  Patient presents with  . Routine Post Op    POV #1 DOS 09/09/19 HAMMERTOE REPAIR 2ND B/L     29 y.o. female returns for post-op check.  Patient is doing well.  Her pain is under control.  To clean dressings clean dry and intact.  No acute complaints.  Has been ambulating with bilateral surgical shoes  Review of Systems: Negative except as noted in the HPI. Denies N/V/F/Ch.  No past medical history on file.  Current Outpatient Medications:  .  ibuprofen (ADVIL) 800 MG tablet, Take 1 tablet (800 mg total) by mouth every 6 (six) hours as needed., Disp: 60 tablet, Rfl: 1 .  metroNIDAZOLE (METROGEL) 0.75 % vaginal gel, Place vaginally at bedtime., Disp: , Rfl:  .  oxyCODONE-acetaminophen (PERCOCET) 10-325 MG tablet, Take 1 tablet by mouth every 6 (six) hours as needed for up to 8 days for pain., Disp: 30 tablet, Rfl: 0 .  predniSONE (DELTASONE) 20 MG tablet, prednisone 20 mg tablet, Disp: , Rfl:   Social History   Tobacco Use  Smoking Status Never Smoker  Smokeless Tobacco Never Used    No Known Allergies Objective:  There were no vitals filed for this visit. There is no height or weight on file to calculate BMI. Constitutional Well developed. Well nourished.  Vascular Foot warm and well perfused. Capillary refill normal to all digits.   Neurologic Normal speech. Oriented to person, place, and time. Epicritic sensation to light touch grossly present bilaterally.  Dermatologic Skin healing well without signs of infection. Skin edges well coapted without signs of infection.  Orthopedic: Tenderness to palpation noted about the surgical site.   Radiographs: 3 views of skeletally mature adult foot: Hardware is intact.  No signs of loosening or breaking out noted.  Good correction alignment noted. Assessment: Intact   1. Hammertoe of second toe of right foot   2. Hammertoe  of second toe of left foot   3. Status post foot surgery    Plan:  Patient was evaluated and treated and all questions answered.  S/p foot surgery bilaterally -Progressing as expected post-operatively. -XR: See above -WB Status: Weightbearing as tolerated in surgical shoe -Sutures: Intact.  No signs of dehiscence noted.  No clinical signs of infection noted. -Medications: None -Foot redressed.  No follow-ups on file.

## 2019-09-24 ENCOUNTER — Other Ambulatory Visit: Payer: Self-pay

## 2019-09-24 ENCOUNTER — Ambulatory Visit (INDEPENDENT_AMBULATORY_CARE_PROVIDER_SITE_OTHER): Payer: 59 | Admitting: Podiatry

## 2019-09-24 ENCOUNTER — Encounter: Payer: Self-pay | Admitting: Podiatry

## 2019-09-24 DIAGNOSIS — M2041 Other hammer toe(s) (acquired), right foot: Secondary | ICD-10-CM

## 2019-09-24 DIAGNOSIS — Z9889 Other specified postprocedural states: Secondary | ICD-10-CM

## 2019-09-24 DIAGNOSIS — M2042 Other hammer toe(s) (acquired), left foot: Secondary | ICD-10-CM

## 2019-09-25 ENCOUNTER — Encounter: Payer: Self-pay | Admitting: Podiatry

## 2019-09-25 NOTE — Progress Notes (Signed)
°  Subjective:  Patient ID: Sabrina Graham, female    DOB: 03/07/1990,  MRN: [id number]  Chief Complaint  Patient presents with   Routine Post Op     POV #2 DOS 09/09/19 HAMMERTOE REPAIR 2ND B/L     29 y.o. female returns for post-op check.  Patient is doing well.  No acute complaints.  She has been ambulating with bilateral surgical shoe.  She does not have any pain.  Review of Systems: Negative except as noted in the HPI. Denies N/V/F/Ch.  History reviewed. No pertinent past medical history.  Current Outpatient Medications:    ibuprofen (ADVIL) 800 MG tablet, Take 1 tablet (800 mg total) by mouth every 6 (six) hours as needed., Disp: 60 tablet, Rfl: 1   metroNIDAZOLE (METROGEL) 0.75 % vaginal gel, Place vaginally at bedtime., Disp: , Rfl:    predniSONE (DELTASONE) 20 MG tablet, prednisone 20 mg tablet, Disp: , Rfl:   Social History   Tobacco Use  Smoking Status Never Smoker  Smokeless Tobacco Never Used    No Known Allergies Objective:  There were no vitals filed for this visit. There is no height or weight on file to calculate BMI. Constitutional Well developed. Well nourished.  Vascular Foot warm and well perfused. Capillary refill normal to all digits.   Neurologic Normal speech. Oriented to person, place, and time. Epicritic sensation to light touch grossly present bilaterally.  Dermatologic Skin healing well without signs of infection. Skin edges well coapted without signs of infection.  Orthopedic: Tenderness to palpation noted about the surgical site.   Radiographs: 3 views of skeletally mature adult foot: Hardware is intact.  No signs of loosening or breaking out noted.  Good correction alignment noted. Assessment: Intact   1. Hammertoe of second toe of right foot   2. Hammertoe of second toe of left foot   3. Status post foot surgery    Plan:  Patient was evaluated and treated and all questions answered.  S/p foot surgery bilaterally -Progressing  as expected post-operatively. -XR: See above -WB Status: Weightbearing as tolerated in surgical shoe -Sutures: Stitches were removed today.  No dehiscence noted.  No clinical signs of infection noted. -Medications: None -I will plan on removing the pins during next clinical visit.  No follow-ups on file.

## 2019-09-27 ENCOUNTER — Telehealth: Payer: Self-pay | Admitting: *Deleted

## 2019-09-27 DIAGNOSIS — M79676 Pain in unspecified toe(s): Secondary | ICD-10-CM

## 2019-09-27 NOTE — Telephone Encounter (Signed)
I'm calling to let you know that I faxed your disability papers to [facility].  "Thank you so much!"

## 2019-10-08 ENCOUNTER — Encounter: Payer: Self-pay | Admitting: Podiatry

## 2019-10-08 ENCOUNTER — Ambulatory Visit (INDEPENDENT_AMBULATORY_CARE_PROVIDER_SITE_OTHER): Payer: 59

## 2019-10-08 ENCOUNTER — Ambulatory Visit (INDEPENDENT_AMBULATORY_CARE_PROVIDER_SITE_OTHER): Payer: 59 | Admitting: Podiatry

## 2019-10-08 ENCOUNTER — Other Ambulatory Visit: Payer: Self-pay

## 2019-10-08 DIAGNOSIS — M2041 Other hammer toe(s) (acquired), right foot: Secondary | ICD-10-CM | POA: Diagnosis not present

## 2019-10-08 DIAGNOSIS — M2042 Other hammer toe(s) (acquired), left foot: Secondary | ICD-10-CM

## 2019-10-08 NOTE — Progress Notes (Signed)
  Subjective:  Patient ID: Sabrina Graham, female    DOB: 1990-11-09,  MRN: [id number]  Chief Complaint  Patient presents with  . Routine Post Op    POV #3 DOS 09/09/19 HAMMERTOE REPAIR 2ND B/L  "its doing good"      29 y.o. female returns for post-op check.  Patient is doing well.  No acute complaints.  She has been ambulating with bilateral surgical shoe.  She does not have any pain.  Review of Systems: Negative except as noted in the HPI. Denies N/V/F/Ch.  No past medical history on file.  Current Outpatient Medications:  .  ibuprofen (ADVIL) 800 MG tablet, Take 1 tablet (800 mg total) by mouth every 6 (six) hours as needed., Disp: 60 tablet, Rfl: 1 .  metroNIDAZOLE (METROGEL) 0.75 % vaginal gel, Place vaginally at bedtime., Disp: , Rfl:  .  predniSONE (DELTASONE) 20 MG tablet, prednisone 20 mg tablet, Disp: , Rfl:   Social History   Tobacco Use  Smoking Status Never Smoker  Smokeless Tobacco Never Used    No Known Allergies Objective:  There were no vitals filed for this visit. There is no height or weight on file to calculate BMI. Constitutional Well developed. Well nourished.  Vascular Foot warm and well perfused. Capillary refill normal to all digits.   Neurologic Normal speech. Oriented to person, place, and time. Epicritic sensation to light touch grossly present bilaterally.  Dermatologic Skin healing well without signs of infection. Skin edges well coapted without signs of infection.  Reduction of hammertoe noted.  Orthopedic:  No tenderness to palpation noted about the surgical site.   Radiographs: 3 views of skeletally mature adult foot: Hardware is intact.  No signs of loosening or breaking out noted.  Good correction alignment noted.  Hardware is intact Assessment: Intact   1. Hammertoe of second toe of right foot   2. Hammertoe of second toe of left foot    Plan:  Patient was evaluated and treated and all questions answered.  S/p foot surgery  bilaterally -Progressing as expected post-operatively. -XR: See above -WB Status: Weightbearing as tolerated in surgical shoe -Sutures: None -Medications: None -K wires removed without any complication.  Triple Antibiotic Band-Aid was applied -Patient is officially discharged from my care.  If any foot and ankle issues arises in the future.  I have asked her to come back and see me.  Patient states understanding  No follow-ups on file.
